# Patient Record
Sex: Female | Born: 1990 | Race: Black or African American | Hispanic: No | Marital: Single | Smoking: Current every day smoker
Health system: Southern US, Community
[De-identification: ages and names within clinical notes are randomized; demographics above are authoritative.]

## PROBLEM LIST (undated history)

## (undated) DIAGNOSIS — J45909 Unspecified asthma, uncomplicated: Secondary | ICD-10-CM

## (undated) DIAGNOSIS — A749 Chlamydial infection, unspecified: Secondary | ICD-10-CM

## (undated) DIAGNOSIS — I10 Essential (primary) hypertension: Secondary | ICD-10-CM

## (undated) HISTORY — PX: HERNIA REPAIR: SHX51

---
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
## 2009-07-31 ENCOUNTER — Telehealth (INDEPENDENT_AMBULATORY_CARE_PROVIDER_SITE_OTHER): Payer: Self-pay | Admitting: Internal Medicine

## 2010-07-28 NOTE — L&D Delivery Note (Signed)
Delivery Note At  a viable female infant was delivered via  (Presentation: LOA;  ).  APGAR: , ; weight .   Placenta status:spont , .  Cord:3vc  with the following complications:none .    Anesthesia:  none Episiotomy: none Lacerations: none Suture Repair: none Est. Blood Loss300 (mL):   Mom to postpartum.  Baby to nursery-stable.  Zerita Boers 04/27/2011, 12:59 PM

## 2010-08-01 ENCOUNTER — Emergency Department (HOSPITAL_COMMUNITY)
Admission: EM | Admit: 2010-08-01 | Discharge: 2010-08-01 | Payer: Self-pay | Source: Home / Self Care | Admitting: Emergency Medicine

## 2010-08-01 LAB — POCT PREGNANCY, URINE: Preg Test, Ur: NEGATIVE

## 2010-08-27 NOTE — Progress Notes (Signed)
  Phone Note Other Incoming   Summary of Call: Request from EMR to remove diagnosis--ICD 9 code apparently for HIV and problem list documents as HIV negative test from preload. Initial call taken by: Julieanne Manson MD,  July 31, 2009 4:46 PM

## 2010-10-21 ENCOUNTER — Inpatient Hospital Stay (INDEPENDENT_AMBULATORY_CARE_PROVIDER_SITE_OTHER)
Admission: RE | Admit: 2010-10-21 | Discharge: 2010-10-21 | Disposition: A | Discharge status: Home / Self Care | Payer: Medicaid Other | Source: Ambulatory Visit | Attending: Emergency Medicine | Admitting: Emergency Medicine

## 2010-10-21 DIAGNOSIS — Z331 Pregnant state, incidental: Secondary | ICD-10-CM

## 2010-10-21 DIAGNOSIS — R198 Other specified symptoms and signs involving the digestive system and abdomen: Secondary | ICD-10-CM

## 2010-10-21 LAB — POCT URINALYSIS DIP (DEVICE)
Bilirubin Urine: NEGATIVE
Glucose, UA: NEGATIVE mg/dL
Hgb urine dipstick: NEGATIVE
Ketones, ur: 40 mg/dL — AB
Nitrite: NEGATIVE
Protein, ur: 100 mg/dL — AB
Specific Gravity, Urine: >=1.03 (ref 1.005–1.030)
Urobilinogen, UA: 1 mg/dL (ref 0.0–1.0)
pH: 5.5 (ref 5.0–8.0)

## 2010-10-21 LAB — POCT PREGNANCY, URINE: Preg Test, Ur: POSITIVE

## 2010-11-29 ENCOUNTER — Inpatient Hospital Stay (HOSPITAL_COMMUNITY)
Admission: AD | Admit: 2010-11-29 | Discharge: 2010-11-29 | Disposition: A | Discharge status: Home / Self Care | Payer: BC Managed Care – PPO | Source: Ambulatory Visit | Attending: Obstetrics and Gynecology | Admitting: Obstetrics and Gynecology

## 2010-11-29 DIAGNOSIS — N760 Acute vaginitis: Secondary | ICD-10-CM

## 2010-11-29 DIAGNOSIS — O2390 Unspecified genitourinary tract infection in pregnancy, unspecified trimester: Secondary | ICD-10-CM

## 2010-11-29 DIAGNOSIS — R109 Unspecified abdominal pain: Secondary | ICD-10-CM

## 2010-11-29 DIAGNOSIS — A499 Bacterial infection, unspecified: Secondary | ICD-10-CM

## 2010-11-29 DIAGNOSIS — B9689 Other specified bacterial agents as the cause of diseases classified elsewhere: Secondary | ICD-10-CM | POA: Insufficient documentation

## 2010-11-29 LAB — URINALYSIS, ROUTINE W REFLEX MICROSCOPIC
Bilirubin Urine: NEGATIVE
Glucose, UA: NEGATIVE mg/dL
Hgb urine dipstick: NEGATIVE
Ketones, ur: NEGATIVE mg/dL
Nitrite: NEGATIVE
Protein, ur: NEGATIVE mg/dL
Specific Gravity, Urine: 1.025 (ref 1.005–1.030)
Urobilinogen, UA: 1 mg/dL (ref 0.0–1.0)
pH: 6 (ref 5.0–8.0)

## 2010-11-29 LAB — WET PREP, GENITAL
Trich, Wet Prep: NONE SEEN
Yeast Wet Prep HPF POC: NONE SEEN

## 2010-11-30 LAB — GC/CHLAMYDIA PROBE AMP, GENITAL
Chlamydia, DNA Probe: NEGATIVE
GC Probe Amp, Genital: NEGATIVE

## 2011-01-11 ENCOUNTER — Inpatient Hospital Stay (HOSPITAL_COMMUNITY)
Admission: AD | Admit: 2011-01-11 | Discharge: 2011-01-11 | Disposition: A | Discharge status: Home / Self Care | Payer: BC Managed Care – PPO | Source: Ambulatory Visit | Attending: Obstetrics & Gynecology | Admitting: Obstetrics & Gynecology

## 2011-01-11 DIAGNOSIS — N760 Acute vaginitis: Secondary | ICD-10-CM

## 2011-01-11 DIAGNOSIS — R109 Unspecified abdominal pain: Secondary | ICD-10-CM

## 2011-01-11 DIAGNOSIS — A499 Bacterial infection, unspecified: Secondary | ICD-10-CM | POA: Insufficient documentation

## 2011-01-11 DIAGNOSIS — O2390 Unspecified genitourinary tract infection in pregnancy, unspecified trimester: Secondary | ICD-10-CM | POA: Insufficient documentation

## 2011-01-11 DIAGNOSIS — B9689 Other specified bacterial agents as the cause of diseases classified elsewhere: Secondary | ICD-10-CM | POA: Insufficient documentation

## 2011-01-11 LAB — URINALYSIS, ROUTINE W REFLEX MICROSCOPIC
Bilirubin Urine: NEGATIVE
Glucose, UA: NEGATIVE mg/dL
Hgb urine dipstick: NEGATIVE
Ketones, ur: NEGATIVE mg/dL
Nitrite: NEGATIVE
Protein, ur: NEGATIVE mg/dL
Specific Gravity, Urine: 1.01 (ref 1.005–1.030)
Urobilinogen, UA: 0.2 mg/dL (ref 0.0–1.0)
pH: 7 (ref 5.0–8.0)

## 2011-01-11 LAB — CBC
HCT: 31 % — ABNORMAL LOW (ref 36.0–46.0)
Hemoglobin: 10.2 g/dL — ABNORMAL LOW (ref 12.0–15.0)
MCH: 28.4 pg (ref 26.0–34.0)
MCHC: 32.9 g/dL (ref 30.0–36.0)
MCV: 86.4 fL (ref 78.0–100.0)
Platelets: 238 10*3/uL (ref 150–400)
RBC: 3.59 MIL/uL — ABNORMAL LOW (ref 3.87–5.11)
RDW: 13.8 % (ref 11.5–15.5)
WBC: 11.2 10*3/uL — ABNORMAL HIGH (ref 4.0–10.5)

## 2011-01-11 LAB — URINE MICROSCOPIC-ADD ON

## 2011-02-12 ENCOUNTER — Other Ambulatory Visit: Payer: Self-pay | Admitting: Family Medicine

## 2011-02-12 DIAGNOSIS — Z3689 Encounter for other specified antenatal screening: Secondary | ICD-10-CM

## 2011-02-12 LAB — ANTIBODY SCREEN: Antibody Screen: NEGATIVE

## 2011-02-12 LAB — RPR
RPR: NONREACTIVE
RPR: NONREACTIVE
RPR: NONREACTIVE

## 2011-02-12 LAB — HIV ANTIBODY (ROUTINE TESTING W REFLEX)
HIV: NONREACTIVE
HIV: NONREACTIVE

## 2011-02-12 LAB — ABO/RH: RH Type: POSITIVE

## 2011-02-14 ENCOUNTER — Ambulatory Visit (HOSPITAL_COMMUNITY)
Admission: RE | Admit: 2011-02-14 | Discharge: 2011-02-14 | Disposition: A | Discharge status: Home / Self Care | Payer: BC Managed Care – PPO | Source: Ambulatory Visit | Attending: Family Medicine | Admitting: Family Medicine

## 2011-02-14 DIAGNOSIS — Z363 Encounter for antenatal screening for malformations: Secondary | ICD-10-CM | POA: Insufficient documentation

## 2011-02-14 DIAGNOSIS — O3660X0 Maternal care for excessive fetal growth, unspecified trimester, not applicable or unspecified: Secondary | ICD-10-CM | POA: Insufficient documentation

## 2011-02-14 DIAGNOSIS — Z3689 Encounter for other specified antenatal screening: Secondary | ICD-10-CM

## 2011-02-14 DIAGNOSIS — Z1389 Encounter for screening for other disorder: Secondary | ICD-10-CM | POA: Insufficient documentation

## 2011-02-14 DIAGNOSIS — O358XX0 Maternal care for other (suspected) fetal abnormality and damage, not applicable or unspecified: Secondary | ICD-10-CM | POA: Insufficient documentation

## 2011-04-27 ENCOUNTER — Inpatient Hospital Stay (HOSPITAL_COMMUNITY)
Admission: AD | Admit: 2011-04-27 | Discharge: 2011-04-29 | DRG: 373 | Disposition: A | Discharge status: Home / Self Care | Payer: BC Managed Care – PPO | Source: Ambulatory Visit | Attending: Obstetrics & Gynecology | Admitting: Obstetrics & Gynecology

## 2011-04-27 ENCOUNTER — Encounter (HOSPITAL_COMMUNITY): Payer: Self-pay | Admitting: Obstetrics and Gynecology

## 2011-04-27 HISTORY — DX: Chlamydial infection, unspecified: A74.9

## 2011-04-27 LAB — CBC
Hemoglobin: 11.7 g/dL — ABNORMAL LOW (ref 12.0–15.0)
MCH: 27.8 pg (ref 26.0–34.0)
RBC: 4.21 MIL/uL (ref 3.87–5.11)

## 2011-04-27 LAB — HIV ANTIBODY (ROUTINE TESTING W REFLEX): HIV: NONREACTIVE

## 2011-04-27 LAB — RPR: RPR Ser Ql: NONREACTIVE

## 2011-04-27 MED ORDER — LACTATED RINGERS IV SOLN
INTRAVENOUS | Status: DC
  Administered 2011-04-27: 125 mL/h via INTRAVENOUS

## 2011-04-27 MED ORDER — DIBUCAINE 1 % RE OINT: 1.0000 "application " | TOPICAL_OINTMENT | RECTAL | Status: DC | PRN

## 2011-04-27 MED ORDER — OXYTOCIN 20 UNITS IN LACTATED RINGERS INFUSION - SIMPLE
INTRAVENOUS | Status: AC
  Filled 2011-04-27: qty 1000

## 2011-04-27 MED ORDER — MEASLES, MUMPS & RUBELLA VAC ~~LOC~~ INJ
0.5000 mL | INJECTION | Freq: Once | SUBCUTANEOUS | Status: AC
  Administered 2011-04-29: 0.5 mL via SUBCUTANEOUS
  Filled 2011-04-27 (×2): qty 0.5

## 2011-04-27 MED ORDER — OXYTOCIN BOLUS FROM INFUSION
500.0000 mL | Freq: Once | INTRAVENOUS | Status: DC
  Administered 2011-04-27: 500 mL via INTRAVENOUS
  Filled 2011-04-27: qty 500

## 2011-04-27 MED ORDER — CITRIC ACID-SODIUM CITRATE 334-500 MG/5ML PO SOLN: 30.0000 mL | ORAL | Status: DC | PRN

## 2011-04-27 MED ORDER — SIMETHICONE 80 MG PO CHEW: 80.0000 mg | CHEWABLE_TABLET | ORAL | Status: DC | PRN

## 2011-04-27 MED ORDER — WITCH HAZEL-GLYCERIN EX PADS: 1.0000 "application " | MEDICATED_PAD | CUTANEOUS | Status: DC | PRN

## 2011-04-27 MED ORDER — IBUPROFEN 600 MG PO TABS: 600.0000 mg | ORAL_TABLET | Freq: Four times a day (QID) | ORAL | Status: DC | PRN

## 2011-04-27 MED ORDER — DIPHENHYDRAMINE HCL 25 MG PO CAPS: 25.0000 mg | ORAL_CAPSULE | Freq: Four times a day (QID) | ORAL | Status: DC | PRN

## 2011-04-27 MED ORDER — OXYCODONE-ACETAMINOPHEN 5-325 MG PO TABS: 2.0000 | ORAL_TABLET | ORAL | Status: DC | PRN

## 2011-04-27 MED ORDER — ZOLPIDEM TARTRATE 5 MG PO TABS: 5.0000 mg | ORAL_TABLET | Freq: Every evening | ORAL | Status: DC | PRN

## 2011-04-27 MED ORDER — ONDANSETRON HCL 4 MG PO TABS: 4.0000 mg | ORAL_TABLET | ORAL | Status: DC | PRN

## 2011-04-27 MED ORDER — FLEET ENEMA 7-19 GM/118ML RE ENEM: 1.0000 | ENEMA | RECTAL | Status: DC | PRN

## 2011-04-27 MED ORDER — PRENATAL PLUS 27-1 MG PO TABS
1.0000 | ORAL_TABLET | Freq: Every day | ORAL | Status: DC
  Administered 2011-04-28 – 2011-04-29 (×2): 1 via ORAL
  Filled 2011-04-27 (×2): qty 1

## 2011-04-27 MED ORDER — LIDOCAINE HCL (PF) 1 % IJ SOLN
INTRAMUSCULAR | Status: AC
  Filled 2011-04-27: qty 30

## 2011-04-27 MED ORDER — LACTATED RINGERS IV SOLN
500.0000 mL | INTRAVENOUS | Status: DC | PRN
Start: 2011-04-27 — End: 2011-04-27

## 2011-04-27 MED ORDER — ONDANSETRON HCL 4 MG/2ML IJ SOLN: 4.0000 mg | INTRAMUSCULAR | Status: DC | PRN

## 2011-04-27 MED ORDER — IBUPROFEN 600 MG PO TABS
600.0000 mg | ORAL_TABLET | Freq: Four times a day (QID) | ORAL | Status: DC
  Administered 2011-04-27 – 2011-04-29 (×7): 600 mg via ORAL
  Filled 2011-04-27 (×7): qty 1

## 2011-04-27 MED ORDER — TETANUS-DIPHTH-ACELL PERTUSSIS 5-2.5-18.5 LF-MCG/0.5 IM SUSP
0.5000 mL | Freq: Once | INTRAMUSCULAR | Status: AC
  Administered 2011-04-28: 0.5 mL via INTRAMUSCULAR
  Filled 2011-04-27: qty 0.5

## 2011-04-27 MED ORDER — LIDOCAINE HCL (PF) 1 % IJ SOLN: 30.0000 mL | INTRAMUSCULAR | Status: DC | PRN

## 2011-04-27 MED ORDER — ONDANSETRON HCL 4 MG/2ML IJ SOLN: 4.0000 mg | Freq: Four times a day (QID) | INTRAMUSCULAR | Status: DC | PRN

## 2011-04-27 MED ORDER — ACETAMINOPHEN 325 MG PO TABS: 650.0000 mg | ORAL_TABLET | ORAL | Status: DC | PRN

## 2011-04-27 MED ORDER — OXYTOCIN 20 UNITS IN LACTATED RINGERS INFUSION - SIMPLE: 125.0000 mL/h | Freq: Once | INTRAVENOUS | Status: DC

## 2011-04-27 MED ORDER — NALBUPHINE SYRINGE 5 MG/0.5 ML
10.0000 mg | INJECTION | INTRAMUSCULAR | Status: DC | PRN
  Administered 2011-04-27: 10 mg via INTRAVENOUS
  Filled 2011-04-27: qty 1

## 2011-04-27 MED ORDER — BENZOCAINE-MENTHOL 20-0.5 % EX AERO: 1.0000 "application " | INHALATION_SPRAY | CUTANEOUS | Status: DC | PRN

## 2011-04-27 MED ORDER — OXYCODONE-ACETAMINOPHEN 5-325 MG PO TABS: 1.0000 | ORAL_TABLET | ORAL | Status: DC | PRN

## 2011-04-27 MED ORDER — SENNOSIDES-DOCUSATE SODIUM 8.6-50 MG PO TABS
2.0000 | ORAL_TABLET | Freq: Every day | ORAL | Status: DC
  Administered 2011-04-27 – 2011-04-28 (×2): 2 via ORAL

## 2011-04-27 MED ORDER — LANOLIN HYDROUS EX OINT: TOPICAL_OINTMENT | CUTANEOUS | Status: DC | PRN

## 2011-04-27 NOTE — Progress Notes (Signed)
Spontaneous vaginal delivery of viable female.  See delivery record

## 2011-04-27 NOTE — H&P (Signed)
Agree with above note.  Natalie Skinner H. 04/27/2011 2:52 PM  

## 2011-04-27 NOTE — Progress Notes (Signed)
Pt is a G1 presents to [facility] via EMS with abdominal pain that comes and goes every 3 mins. PT is [redacted] weeks pregnant, receives care at the health dept.

## 2011-04-27 NOTE — H&P (Signed)
Natalie Skinner is a 20 y.o. female presenting for labor. Maternal Medical History:  Reason for admission: Reason for admission: contractions.  Contractions: Onset was 13-24 hours ago.   Frequency: regular.   Perceived severity is strong.    Fetal activity: Perceived fetal activity is normal.   Last perceived fetal movement was within the past hour.      OB History    Grav Para Term Preterm Abortions TAB SAB Ect Mult Living   1 0 0 0 0 0 0 0 0 0      Past Medical History  Diagnosis Date  . Chlamydia    Past Surgical History  Procedure Date  . Hernia repair    Family History: family history is not on file. Social History:  reports that she has never smoked. She does not have any smokeless tobacco history on file. She reports that she does not drink alcohol or use illicit drugs.  Review of Systems  Constitutional: Negative.   HENT: Negative.   Eyes: Negative.   Respiratory: Negative.   Cardiovascular: Negative.   Gastrointestinal: Negative.   Genitourinary: Negative.   Musculoskeletal: Negative.   Skin: Negative.   Neurological: Negative.   Endo/Heme/Allergies: Negative.   Psychiatric/Behavioral: Negative.     Dilation: 10 Effacement (%): 100 Station: -1 Blood pressure 126/76, pulse 59, temperature 97.9 F (36.6 C), temperature source Oral, resp. rate 18. Maternal Exam:  Uterine Assessment: Contraction strength is firm.  Contraction frequency is regular.   Abdomen: Patient reports no abdominal tenderness. Fetal presentation: vertex  Introitus: Normal vulva. Normal vagina.    Physical Exam  Constitutional: She is oriented to person, place, and time. She appears well-developed and well-nourished.  Cardiovascular: Normal rate, regular rhythm, normal heart sounds and intact distal pulses.   Respiratory: Effort normal and breath sounds normal.  GI: Soft. Bowel sounds are normal.  Genitourinary: Vagina normal and uterus normal.  Musculoskeletal: Normal range of  motion.  Neurological: She is alert and oriented to person, place, and time. She has normal reflexes.  Skin: Skin is warm and dry.  Psychiatric: She has a normal mood and affect. Her behavior is normal. Judgment and thought content normal.    Prenatal labs: ABO, Rh:   Antibody:   Rubella:   RPR:    HBsAg:    HIV:    GBS:     Assessment/Plan: Active labor, impending delivery   Zerita Boers 04/27/2011, 12:19 PM

## 2011-04-28 NOTE — Progress Notes (Signed)
Post Partum Day 1 Subjective: no complaints  Objective: Blood pressure 116/65, pulse 76, temperature 98.1 F (36.7 C), temperature source Oral, resp. rate 20, height 5\' 7"  (1.702 m), weight 242 lb (109.77 kg), SpO2 97.00%, unknown if currently breastfeeding.  Physical Exam:  General: alert, cooperative, appears stated age and no distress Lochia: appropriate Uterine Fundus: firm Incision: NA DVT Evaluation: No evidence of DVT seen on physical exam.   Basename 04/27/11 1253  HGB 11.7*  HCT 36.0    Assessment/Plan: Plan for discharge tomorrow, Breastfeeding, Circumcision prior to discharge and Contraception needs progesterone only pills - Micronor    LOS: 1 day   Tyrik Stetzer 04/28/2011, 7:30 AM

## 2011-04-29 MED ORDER — IBUPROFEN 600 MG PO TABS: 600.0000 mg | ORAL_TABLET | Freq: Four times a day (QID) | ORAL | Status: AC

## 2011-04-29 NOTE — Progress Notes (Signed)
PSYCHOSOCIAL ASSESSMENT ~ MATERNAL/CHILD Name:   Natalie Skinner, III                                                                                                       Age: 20  Referral Date:        10/02   /  12 Reason/Source: LPNC /CN  I. FAMILY/HOME ENVIRONMENT A. Child's Legal Guardian __X_Parent(s) ___Grandparent ___Foster parent ___DSS_________________ Name:    Sandrea Hughs                                                            DOB: //                     Age: 33   Address: [address]; [place], [place] [address]  Name:  Angela Cox                                                               DOB: //                     Age: 1  Address:  B. Other Household Members/Support Persons Name:                                         Relationship:                        DOB ___/___/___                   Name:                                         Relationship:                        DOB ___/___/___                   Name:                                         Relationship:                        DOB ___/___/___  Name:                                         Relationship:                        DOB ___/___/___  C. Other Support:   II. PSYCHOSOCIAL DATA A. Information Source                                                                                             _X_Patient Interview  __Family Interview           __Other___________  B. 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  ___School:                                                                         Grade:  __Other:   Salena Saner Cultural and Environment Information Cultural Issues Impacting  Care:  III. STRENGTHS _X__Supportive family/friends _X__Adequate Resources ___Compliance with medical plan _X__Home prepared for Child (including basic supplies) ___Understanding of illness      ___Other: RISK FACTORS AND CURRENT PROBLEMS         ____No Problems Noted             LPNC  IV. SOCIAL WORK ASSESSMENT  Pt didn't start [facility] prior to 28 weeks because she was unaware of pregnancy.  Once pregnancy was confirmed by staff at an Urgent Care, she started [facility] and kept appointments regularly.  She denies any illegal substance use and verbalized understanding of hospital drug testing policy.  UDS was missed.  Meconium results are pending.   FOB is at the bedside and supportive.  She has supplies for the infant.  Sw will follow up with drug screen results and make a referral if needed.    V. SOCIAL WORK PLAN  _X__No Further Intervention Required/No Barriers to Discharge   ___Psychosocial Support and Ongoing Assessment of Needs   ___Patient/Family Education:   ___Child Protective Services Report   County___________ Date___/____/____   ___Information/Referral to [employer]_________________________   ___Other:

## 2011-04-29 NOTE — Discharge Summary (Signed)
Obstetric Discharge Summary Reason for Admission: onset of labor Prenatal Procedures: ultrasound Intrapartum Procedures: spontaneous vaginal delivery Postpartum Procedures: none Complications-Operative and Postpartum: none Hemoglobin  Date Value Range Status  04/27/2011 11.7* 12.0-15.0 (g/dL) Final     HCT  Date Value Range Status  04/27/2011 36.0  36.0-46.0 (%) Final    Discharge Diagnoses: Term Pregnancy-delivered  Discharge Information: Date: 04/29/2011 Activity: pelvic rest Diet: routine Medications: PNV and Ibuprofen, Micronor for contraception - told to start 4 weeks after d/c  Condition: stable Instructions: refer to practice specific booklet Discharge to: home Follow-up Information    Follow up with [facility] [place]. Make an appointment in 6 weeks. (Postpartrum visit)    Contact information:   [address] [employer] [place] [address]          Newborn Data: Live born female  Birth Weight: 7 lb (3175 g) APGAR: 8, 9  Home with mother.  SMITH,JOSHUA 04/29/2011, 7:47 AM

## 2011-04-29 NOTE — Discharge Instructions (Signed)

## 2011-04-29 NOTE — Progress Notes (Signed)
Post Partum Day 2 Subjective: no complaints, voiding, tolerating PO, + flatus and No BM yet  Objective: Blood pressure 105/64, pulse 71, temperature 98.6 F (37 C), temperature source Oral, resp. rate 18, height 5\' 7"  (1.702 m), weight 109.77 kg (242 lb), SpO2 99.00%, unknown if currently breastfeeding.  Physical Exam:  General: alert, cooperative and no distress Lochia: appropriate Uterine Fundus: firm DVT Evaluation: No evidence of DVT seen on physical exam. Negative Homan's sign. No cords or calf tenderness. No significant calf/ankle edema.   Basename 04/27/11 1253  HGB 11.7*  HCT 36.0    Assessment/Plan: Discharge home and Circumcision prior to discharge   LOS: 2 days   Karandeep Resende 04/29/2011, 7:15 AM

## 2011-04-30 NOTE — Discharge Summary (Signed)
Agree with above note.  Natalie Skinner H. 04/30/2011 9:43 AM

## 2011-05-03 NOTE — Progress Notes (Signed)
I agree with the above. Cam Hai 8:33 PM 05/03/2011

## 2011-11-01 IMAGING — US US OB DETAIL+14 WK
1 series · 12 of 28 positions shown · non-contrast
Comparison: none

[Series 1: us ob detail +14 wk · 12 of 96 slices shown]
[im 4/96]
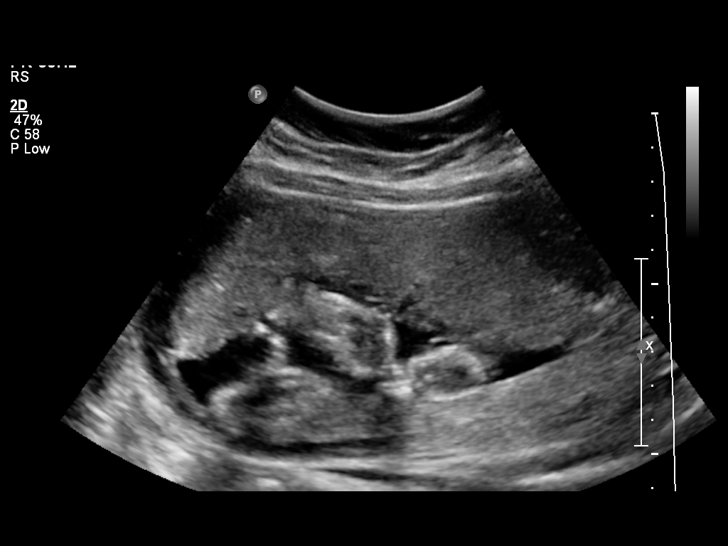
[im 11/96]
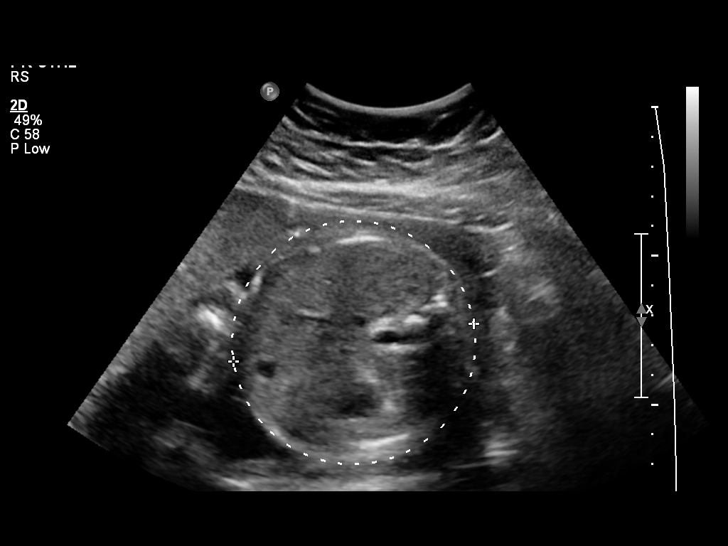
[im 18/96]
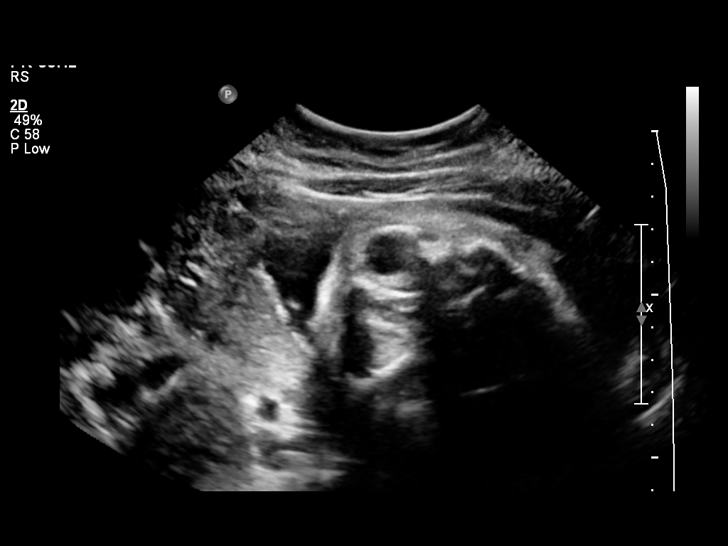
[im 29/96]
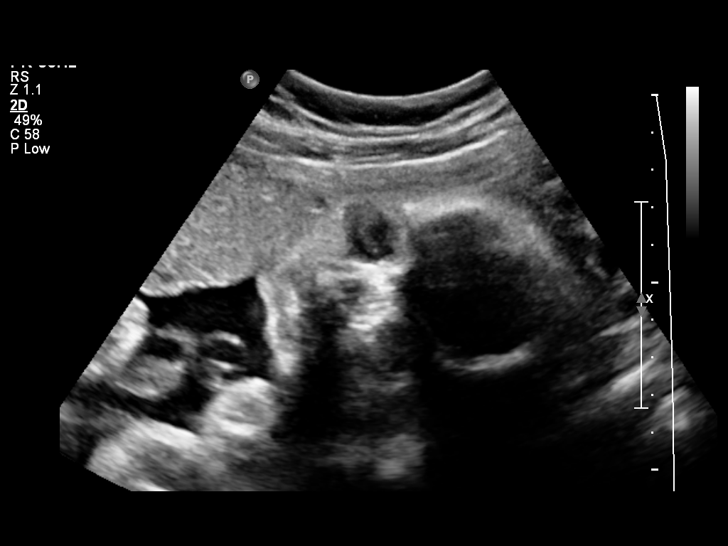
[im 36/96]
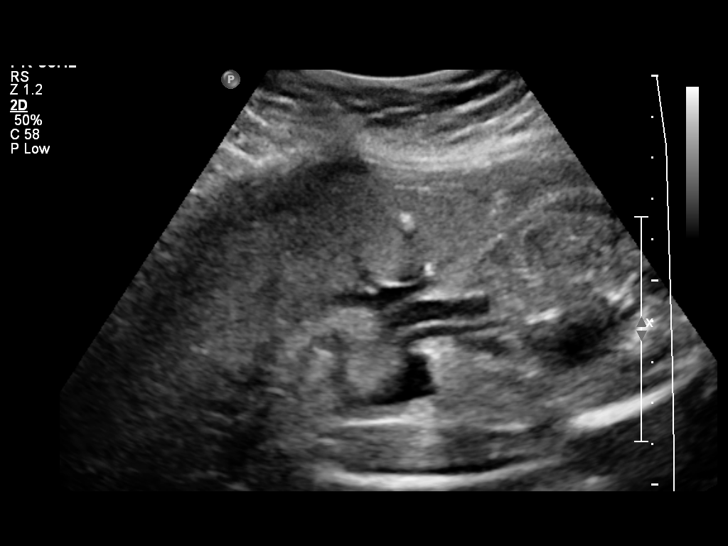
[im 43/96]
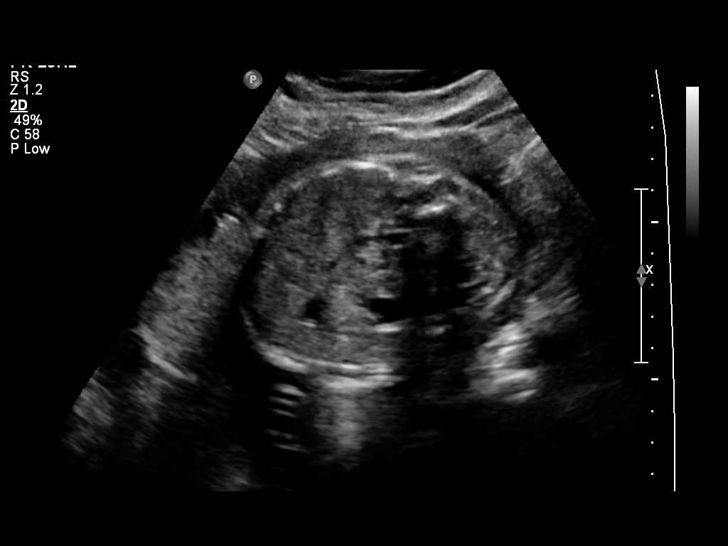
[im 53/96]
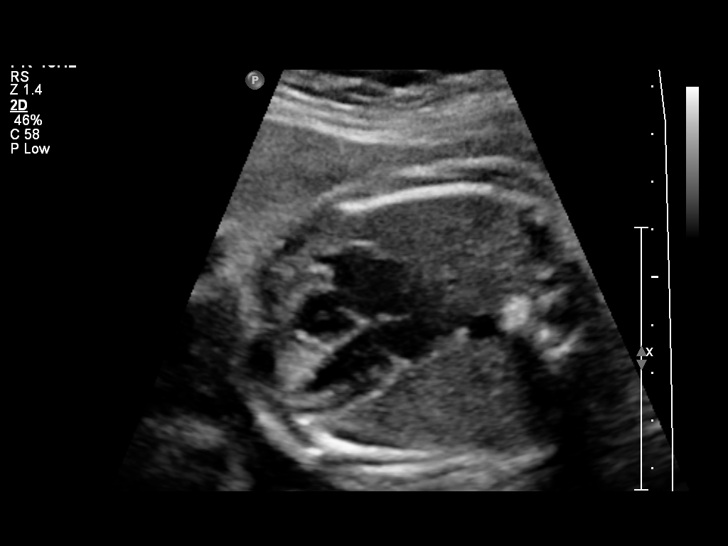
[im 60/96]
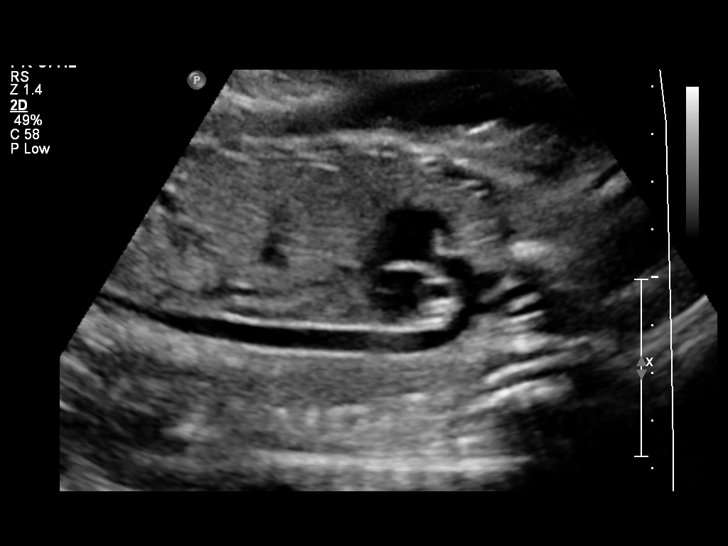
[im 67/96]
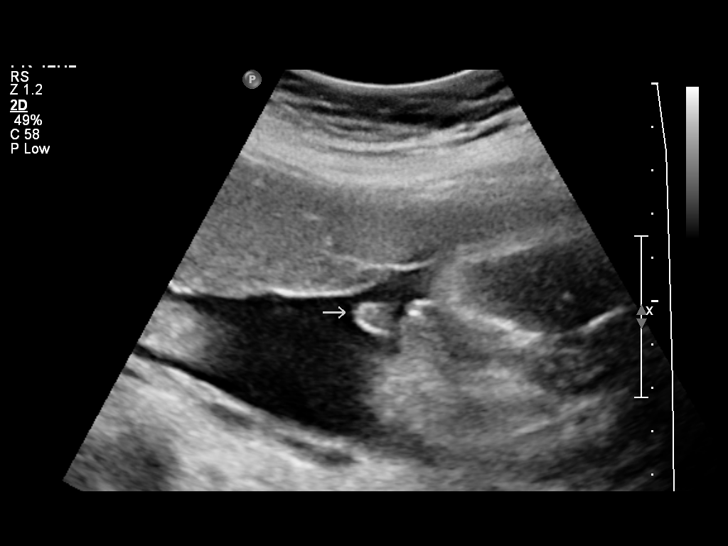
[im 78/96]
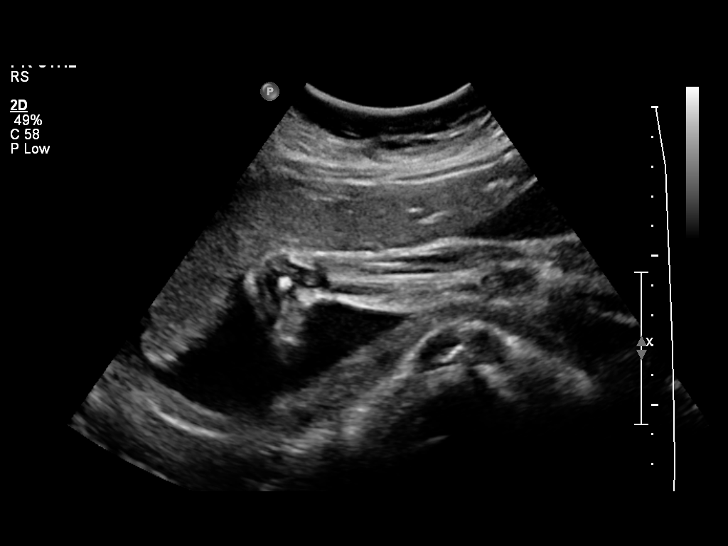
[im 85/96]
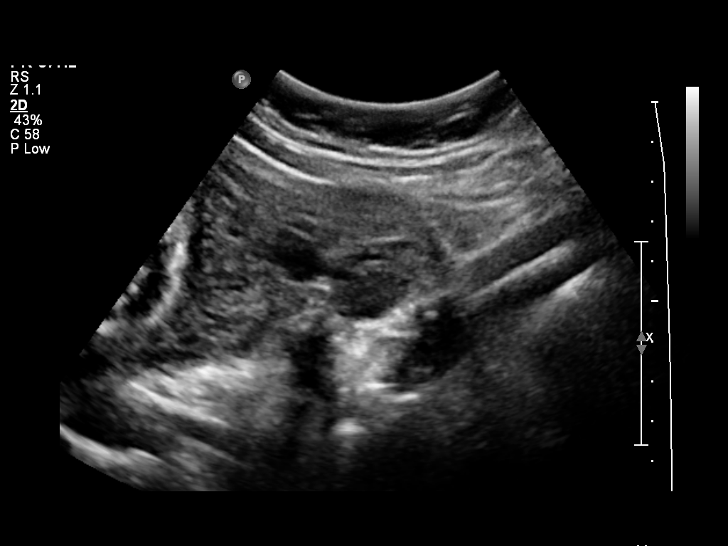
[im 92/96]
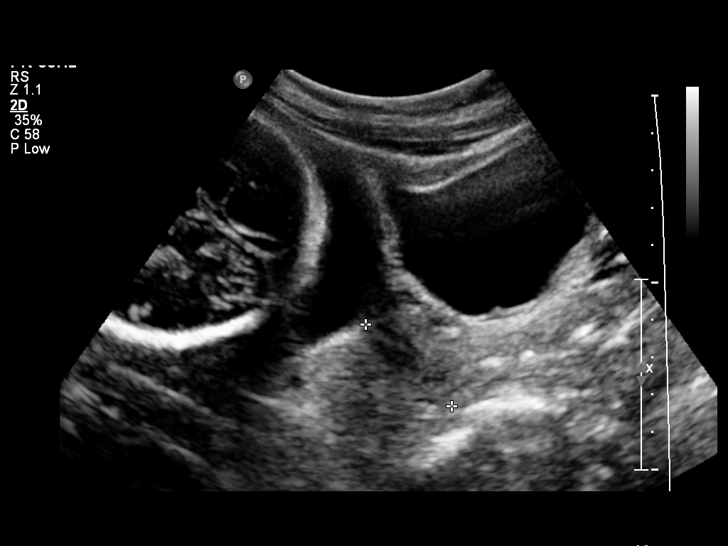

[12 of 28 positions shown; findings below may reference images not displayed]

OBSTETRICS REPORT
                      (Signed Final 02/14/2011 [DATE])

 Order#:         [id number]
Procedures

 US OB DETAIL + 14 WK                                  76811.0
Indications

 Detailed fetal anatomic survey
 Late prenatal care
 Size greater than dates (Large for gestational [AGE]
Fetal Evaluation

 Fetal Heart Rate:  142                          bpm
 Cardiac Activity:  Observed
 Presentation:      Cephalic
 Placenta:          Anterior, above cervical os
 P. Cord            Appears WNL
 Insertion:

 Amniotic Fluid
 AFI FV:      Subjectively within normal limits
 AFI Sum:     12.15   cm       31  %Tile     Larg Pckt:    4.61  cm
 RUQ:   2.23    cm   RLQ:    4.61   cm    LUQ:   2.38    cm   LLQ:    2.93   cm
Biometry

 BPD:     73.4  mm     G. Age:  29w 3d                CI:        71.06   70 - 86
                                                      FL/HC:      20.5   19.2 -

 HC:     277.4  mm     G. Age:  30w 2d       29  %    HC/AC:      1.10   0.99 -

 AC:     253.3  mm     G. Age:  29w 4d       35  %    FL/BPD:     77.7   71 - 87
 FL:        57  mm     G. Age:  29w 6d       37  %    FL/AC:      22.5   20 - 24
 HUM:     50.3  mm     G. Age:  29w 3d       44  %

 Est. FW:    6886  gm      3 lb 3 oz     50  %
Gestational Age

 LMP:           24w 5d        Date:  08/25/10                 EDD:   06/01/11
 U/S Today:     29w 6d                                        EDD:   04/26/11
 Best:          29w 6d     Det. By:  U/S (02/14/11)           EDD:   04/26/11
Genetic Sonogram - Trisomy 21 Screening
 Age:                                             19          Risk=1:   885
 Echogenic bowel:                                 No          LR :
 Choroid plexus cysts:                            No
 Structural anomalies (inc. cardiac):             No          LR :
 Short femur:                                     No          LR :
 Short humerus:                                   No          LR :
 2-vessel umbilical cord:                         No
 Pyelectasis:                                     No          LR :
 Echogenic cardiac foci:                          No          LR :

 8 Of 8 Criteria Were Visualized and 0 Abnormal(s) Were Seen.
 Ultrasound Modified Risk for Fetal Down Syndrome = [DATE]
Anatomy

 Cranium:           Appears normal      Aortic Arch:       Appears normal
 Fetal Cavum:       Appears normal      Ductal Arch:       Appears normal
 Ventricles:        Appears normal      Diaphragm:         Appears normal
 Choroid Plexus:    Appears normal      Stomach:           Appears
                                                           normal, left
                                                           sided
 Cerebellum:        Appears normal      Abdomen:           Appears normal
 Posterior Fossa:   Appears normal      Abdominal Wall:    Appears nml
                                                           (cord insert,
                                                           abd wall)
 Nuchal Fold:       Not applicable      Cord Vessels:      Appears normal
                    (>20 wks GA)                           (3 vessel cord)
 Face:              Appears normal      Kidneys:           Appear normal
                    (lips/profile/orbit
                    s)
 Heart:             Appears normal      Bladder:           Appears normal
                    (4 chamber &
                    axis)
 RVOT:              Appears normal      Spine:             Appears normal
 LVOT:              Appears normal      Limbs:             Four extremities
                                                           seen

 Other:     Male gender. Nasal bone visualized. Heels visualized.
            Technically difficult due to advanced GA and fetal
            position.
Cervix Uterus Adnexa

 Cervical Length:    3.3      cm

 Cervix:       Normal appearance by transabdominal scan.

 Left Ovary:    Within normal limits.
 Right Ovary:   Within normal limits.
 Adnexa:     No abnormality visualized.
Impression

 Single live IUP in cephalic presentation.  Measuring 01w8d,
 suggesting inaccuracy of the LMP.
 No anatomic abnormality seen with a good quality survey
 possible.

 questions or concerns.

## 2012-01-14 ENCOUNTER — Encounter (HOSPITAL_COMMUNITY): Payer: Self-pay | Admitting: Emergency Medicine

## 2012-01-14 ENCOUNTER — Emergency Department (HOSPITAL_COMMUNITY)
Admission: EM | Admit: 2012-01-14 | Discharge: 2012-01-14 | Disposition: A | Discharge status: Home / Self Care | Payer: BC Managed Care – PPO | Attending: Emergency Medicine | Admitting: Emergency Medicine

## 2012-01-14 DIAGNOSIS — Y92410 Unspecified street and highway as the place of occurrence of the external cause: Secondary | ICD-10-CM | POA: Insufficient documentation

## 2012-01-14 DIAGNOSIS — N390 Urinary tract infection, site not specified: Secondary | ICD-10-CM

## 2012-01-14 DIAGNOSIS — I10 Essential (primary) hypertension: Secondary | ICD-10-CM | POA: Insufficient documentation

## 2012-01-14 DIAGNOSIS — J45909 Unspecified asthma, uncomplicated: Secondary | ICD-10-CM | POA: Insufficient documentation

## 2012-01-14 DIAGNOSIS — M25569 Pain in unspecified knee: Secondary | ICD-10-CM | POA: Insufficient documentation

## 2012-01-14 DIAGNOSIS — F17200 Nicotine dependence, unspecified, uncomplicated: Secondary | ICD-10-CM | POA: Insufficient documentation

## 2012-01-14 DIAGNOSIS — R109 Unspecified abdominal pain: Secondary | ICD-10-CM | POA: Insufficient documentation

## 2012-01-14 HISTORY — DX: Unspecified asthma, uncomplicated: J45.909

## 2012-01-14 HISTORY — DX: Essential (primary) hypertension: I10

## 2012-01-14 LAB — URINE MICROSCOPIC-ADD ON

## 2012-01-14 LAB — URINALYSIS, ROUTINE W REFLEX MICROSCOPIC
Glucose, UA: NEGATIVE mg/dL
Hgb urine dipstick: NEGATIVE
Ketones, ur: NEGATIVE mg/dL
Protein, ur: 100 mg/dL — AB
pH: 6 (ref 5.0–8.0)

## 2012-01-14 LAB — POCT PREGNANCY, URINE: Preg Test, Ur: NEGATIVE

## 2012-01-14 MED ORDER — IBUPROFEN 800 MG PO TABS: 800.0000 mg | ORAL_TABLET | Freq: Three times a day (TID) | ORAL | Status: AC

## 2012-01-14 MED ORDER — DIAZEPAM 5 MG PO TABS: 5.0000 mg | ORAL_TABLET | Freq: Every day | ORAL | Status: AC

## 2012-01-14 MED ORDER — NITROFURANTOIN MONOHYD MACRO 100 MG PO CAPS: 100.0000 mg | ORAL_CAPSULE | Freq: Two times a day (BID) | ORAL | Status: AC
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
## 2012-07-28 NOTE — L&D Delivery Note (Signed)
Delivery Note At 1:35 AM a viable female was delivered via  (Presentation: ROA).  APGAR: 8, 9 ; weight pending .   Placenta status: spontaneous, intact.  Cord: 3VC,  with the following complications: none.    Anesthesia:  Fentanyl   Episiotomy: none Lacerations: none Est. Blood Loss (mL):  Mom to postpartum.  Baby to Couplet care / Skin to Skin.  STINSON, JACOB JEHIEL 07/25/2013, 1:50 AM

## 2013-03-17 ENCOUNTER — Other Ambulatory Visit (HOSPITAL_COMMUNITY): Payer: Self-pay | Admitting: Nurse Practitioner

## 2013-03-17 DIAGNOSIS — Z0489 Encounter for examination and observation for other specified reasons: Secondary | ICD-10-CM

## 2013-03-17 LAB — OB RESULTS CONSOLE HEPATITIS B SURFACE ANTIGEN: Hepatitis B Surface Ag: NEGATIVE

## 2013-03-17 LAB — OB RESULTS CONSOLE ANTIBODY SCREEN: Antibody Screen: NEGATIVE

## 2013-03-17 LAB — OB RESULTS CONSOLE ABO/RH: RH Type: POSITIVE

## 2013-03-17 LAB — OB RESULTS CONSOLE RPR: RPR: NONREACTIVE

## 2013-04-07 ENCOUNTER — Ambulatory Visit (HOSPITAL_COMMUNITY): Payer: BC Managed Care – PPO

## 2013-04-08 ENCOUNTER — Ambulatory Visit (HOSPITAL_COMMUNITY)
Admission: RE | Admit: 2013-04-08 | Discharge: 2013-04-08 | Disposition: A | Discharge status: Home / Self Care | Payer: BC Managed Care – PPO | Source: Ambulatory Visit | Attending: Nurse Practitioner | Admitting: Nurse Practitioner

## 2013-04-08 ENCOUNTER — Other Ambulatory Visit (HOSPITAL_COMMUNITY): Payer: Self-pay | Admitting: Nurse Practitioner

## 2013-04-08 DIAGNOSIS — O10019 Pre-existing essential hypertension complicating pregnancy, unspecified trimester: Secondary | ICD-10-CM | POA: Insufficient documentation

## 2013-04-08 DIAGNOSIS — Z0489 Encounter for examination and observation for other specified reasons: Secondary | ICD-10-CM

## 2013-04-08 DIAGNOSIS — Z3689 Encounter for other specified antenatal screening: Secondary | ICD-10-CM | POA: Insufficient documentation

## 2013-04-12 ENCOUNTER — Other Ambulatory Visit (HOSPITAL_COMMUNITY): Payer: Self-pay | Admitting: Nurse Practitioner

## 2013-04-12 DIAGNOSIS — Z0489 Encounter for examination and observation for other specified reasons: Secondary | ICD-10-CM

## 2013-04-18 ENCOUNTER — Ambulatory Visit (HOSPITAL_COMMUNITY): Payer: BC Managed Care – PPO

## 2013-04-22 ENCOUNTER — Ambulatory Visit (HOSPITAL_COMMUNITY)
Admission: RE | Admit: 2013-04-22 | Discharge: 2013-04-22 | Disposition: A | Discharge status: Home / Self Care | Payer: BC Managed Care – PPO | Source: Ambulatory Visit | Attending: Nurse Practitioner | Admitting: Nurse Practitioner

## 2013-04-22 ENCOUNTER — Encounter (HOSPITAL_COMMUNITY): Payer: Self-pay

## 2013-04-22 DIAGNOSIS — Z3689 Encounter for other specified antenatal screening: Secondary | ICD-10-CM | POA: Insufficient documentation

## 2013-04-22 DIAGNOSIS — Z0489 Encounter for examination and observation for other specified reasons: Secondary | ICD-10-CM

## 2013-04-22 DIAGNOSIS — O10019 Pre-existing essential hypertension complicating pregnancy, unspecified trimester: Secondary | ICD-10-CM | POA: Insufficient documentation

## 2013-04-22 DIAGNOSIS — O0930 Supervision of pregnancy with insufficient antenatal care, unspecified trimester: Secondary | ICD-10-CM | POA: Insufficient documentation

## 2013-06-28 LAB — OB RESULTS CONSOLE GC/CHLAMYDIA: Chlamydia: NEGATIVE

## 2013-06-30 LAB — OB RESULTS CONSOLE GBS: GBS: NEGATIVE

## 2013-07-24 ENCOUNTER — Encounter (HOSPITAL_COMMUNITY): Payer: Self-pay | Admitting: General Practice

## 2013-07-24 ENCOUNTER — Inpatient Hospital Stay (HOSPITAL_COMMUNITY)
Admission: AD | Admit: 2013-07-24 | Discharge: 2013-07-27 | DRG: 775 | Disposition: A | Discharge status: Home / Self Care | Payer: BC Managed Care – PPO | Source: Ambulatory Visit | Attending: Obstetrics and Gynecology | Admitting: Obstetrics and Gynecology

## 2013-07-24 DIAGNOSIS — O99334 Smoking (tobacco) complicating childbirth: Secondary | ICD-10-CM | POA: Diagnosis present

## 2013-07-24 DIAGNOSIS — O4290 Premature rupture of membranes, unspecified as to length of time between rupture and onset of labor, unspecified weeks of gestation: Secondary | ICD-10-CM | POA: Diagnosis present

## 2013-07-24 LAB — TYPE AND SCREEN
ABO/RH(D): A POS
Antibody Screen: NEGATIVE

## 2013-07-24 LAB — RPR: RPR Ser Ql: NONREACTIVE

## 2013-07-24 LAB — CBC
HCT: 32 % — ABNORMAL LOW (ref 36.0–46.0)
Hemoglobin: 10.5 g/dL — ABNORMAL LOW (ref 12.0–15.0)
MCH: 27.2 pg (ref 26.0–34.0)
MCHC: 32.8 g/dL (ref 30.0–36.0)
RBC: 3.86 MIL/uL — ABNORMAL LOW (ref 3.87–5.11)

## 2013-07-24 LAB — AMNISURE RUPTURE OF MEMBRANE (ROM) NOT AT ARMC: Amnisure ROM: POSITIVE

## 2013-07-24 MED ORDER — FENTANYL CITRATE 0.05 MG/ML IJ SOLN
100.0000 ug | INTRAMUSCULAR | Status: DC | PRN
  Administered 2013-07-24 (×2): 100 ug via INTRAVENOUS
  Filled 2013-07-24 (×3): qty 2

## 2013-07-24 MED ORDER — CITRIC ACID-SODIUM CITRATE 334-500 MG/5ML PO SOLN: 30.0000 mL | ORAL | Status: DC | PRN

## 2013-07-24 MED ORDER — OXYTOCIN 40 UNITS IN LACTATED RINGERS INFUSION - SIMPLE MED: 62.5000 mL/h | INTRAVENOUS | Status: DC

## 2013-07-24 MED ORDER — ACETAMINOPHEN 325 MG PO TABS: 650.0000 mg | ORAL_TABLET | ORAL | Status: DC | PRN

## 2013-07-24 MED ORDER — FLEET ENEMA 7-19 GM/118ML RE ENEM: 1.0000 | ENEMA | RECTAL | Status: DC | PRN

## 2013-07-24 MED ORDER — OXYTOCIN BOLUS FROM INFUSION
500.0000 mL | INTRAVENOUS | Status: DC
  Administered 2013-07-25: 500 mL via INTRAVENOUS

## 2013-07-24 MED ORDER — IBUPROFEN 600 MG PO TABS: 600.0000 mg | ORAL_TABLET | Freq: Four times a day (QID) | ORAL | Status: DC | PRN

## 2013-07-24 MED ORDER — LACTATED RINGERS IV SOLN
INTRAVENOUS | Status: DC
  Administered 2013-07-24 (×2): via INTRAVENOUS

## 2013-07-24 MED ORDER — LACTATED RINGERS IV SOLN
500.0000 mL | INTRAVENOUS | Status: DC | PRN
  Administered 2013-07-25: 300 mL via INTRAVENOUS

## 2013-07-24 MED ORDER — ONDANSETRON HCL 4 MG/2ML IJ SOLN
4.0000 mg | Freq: Four times a day (QID) | INTRAMUSCULAR | Status: DC | PRN
  Administered 2013-07-24: 4 mg via INTRAVENOUS
  Filled 2013-07-24: qty 2

## 2013-07-24 MED ORDER — OXYTOCIN 40 UNITS IN LACTATED RINGERS INFUSION - SIMPLE MED
1.0000 m[IU]/min | INTRAVENOUS | Status: DC
  Administered 2013-07-24: 2 m[IU]/min via INTRAVENOUS
  Filled 2013-07-24: qty 1000

## 2013-07-24 MED ORDER — TERBUTALINE SULFATE 1 MG/ML IJ SOLN: 0.2500 mg | Freq: Once | INTRAMUSCULAR | Status: AC | PRN

## 2013-07-24 MED ORDER — OXYCODONE-ACETAMINOPHEN 5-325 MG PO TABS: 1.0000 | ORAL_TABLET | ORAL | Status: DC | PRN

## 2013-07-24 MED ORDER — LIDOCAINE HCL (PF) 1 % IJ SOLN
30.0000 mL | INTRAMUSCULAR | Status: DC | PRN
  Filled 2013-07-24: qty 30

## 2013-07-24 NOTE — MAU Note (Signed)
Pt in to be evaluated for rupture of membranes

## 2013-07-24 NOTE — H&P (Signed)
JAZAE GANDOLFI is a 22 y.o. female presenting for PROM. Maternal Medical History:  Reason for admission: Rupture of membranes.  Nausea.  Fetal activity: Perceived fetal activity is normal.   Last perceived fetal movement was within the past hour.    Prenatal complications: no prenatal complications Prenatal Complications - Diabetes: none.    OB History   Grav Para Term Preterm Abortions TAB SAB Ect Mult Living   2 1 1  0 0 0 0 0 0 1     Past Medical History  Diagnosis Date  . Chlamydia   . Asthma   . Hypertension   . Normal labor 07/24/2013   Past Surgical History  Procedure Laterality Date  . Hernia repair     Family History: family history is not on file. Social History:  reports that she has been smoking.  She has never used smokeless tobacco. She reports that she does not drink alcohol or use illicit drugs.   Prenatal Transfer Tool  Maternal Diabetes: No Genetic Screening: Declined Maternal Ultrasounds/Referrals: Normal Fetal Ultrasounds or other Referrals:  None Maternal Substance Abuse:  No Significant Maternal Medications:  None Significant Maternal Lab Results:  None Other Comments:  None  Review of Systems  Constitutional: Negative for fever and chills.  Respiratory: Negative for cough, sputum production and shortness of breath.   Cardiovascular: Negative for chest pain.  Gastrointestinal: Negative for heartburn, nausea, vomiting, abdominal pain, diarrhea and constipation.  Neurological: Negative for dizziness.    Dilation: 2.5 Effacement (%): 50 Station: -3 Exam by:: Candelaria Celeste, MD Blood pressure 126/73, pulse 93, temperature 98.2 F (36.8 C), temperature source Oral, resp. rate 18, height 5\' 7"  (1.702 m), weight 129.865 kg (286 lb 4.8 oz), last menstrual period 11/23/2012. Maternal Exam:  Abdomen: Fundal height is term.   Estimated fetal weight is 7.5#.   Fetal presentation: vertex  Introitus: Normal vulva. Normal vagina.  Ferning test:  positive.      Fetal Exam Fetal Monitor Review: Mode: hand-held doppler probe.   Baseline rate: 140s.  Variability: moderate (6-25 bpm).   Pattern: accelerations present and no decelerations.    Fetal State Assessment: Category I - tracings are normal.     Physical Exam  Constitutional: She is oriented to person, place, and time. She appears well-developed and well-nourished.  Cardiovascular: Normal rate and regular rhythm.   Respiratory: Effort normal.  GI: Soft. She exhibits no distension and no mass. There is no tenderness. There is no rebound and no guarding.  Musculoskeletal: Normal range of motion. She exhibits no edema.  Neurological: She is alert and oriented to person, place, and time.  Skin: Skin is warm and dry.  Psychiatric: She has a normal mood and affect. Her behavior is normal. Judgment and thought content normal.    Prenatal labs: ABO, Rh:   A+ Antibody:  neg Rubella:  immune RPR:   NR HBsAg:   Neg HIV:   Neg GBS:   Neg  Assessment/Plan: 1.  G2P1001 at 40.3 weeks 2.  PROM 3.  GBS neg  Will admit to L&D and start pitocin.  Teaching service for peds, anticipates breast feeding, OCPs for birth control.  Anticipate vaginal delivery.   Eular Panek JEHIEL 07/24/2013, 4:26 PM

## 2013-07-25 ENCOUNTER — Encounter (HOSPITAL_COMMUNITY): Payer: Self-pay | Admitting: Family Medicine

## 2013-07-25 DIAGNOSIS — O99334 Smoking (tobacco) complicating childbirth: Secondary | ICD-10-CM

## 2013-07-25 DIAGNOSIS — O4290 Premature rupture of membranes, unspecified as to length of time between rupture and onset of labor, unspecified weeks of gestation: Secondary | ICD-10-CM

## 2013-07-25 LAB — CBC
HCT: 31.4 % — ABNORMAL LOW (ref 36.0–46.0)
MCH: 27.1 pg (ref 26.0–34.0)
MCHC: 32.8 g/dL (ref 30.0–36.0)
MCV: 82.6 fL (ref 78.0–100.0)
RBC: 3.8 MIL/uL — ABNORMAL LOW (ref 3.87–5.11)
RDW: 14.5 % (ref 11.5–15.5)
WBC: 18.7 10*3/uL — ABNORMAL HIGH (ref 4.0–10.5)

## 2013-07-25 MED ORDER — SENNOSIDES-DOCUSATE SODIUM 8.6-50 MG PO TABS
2.0000 | ORAL_TABLET | ORAL | Status: DC
  Administered 2013-07-25 – 2013-07-27 (×2): 2 via ORAL
  Filled 2013-07-25 (×2): qty 2

## 2013-07-25 MED ORDER — IBUPROFEN 600 MG PO TABS
600.0000 mg | ORAL_TABLET | Freq: Four times a day (QID) | ORAL | Status: DC
  Administered 2013-07-25 – 2013-07-27 (×9): 600 mg via ORAL
  Filled 2013-07-25 (×10): qty 1

## 2013-07-25 MED ORDER — BENZOCAINE-MENTHOL 20-0.5 % EX AERO
1.0000 "application " | INHALATION_SPRAY | CUTANEOUS | Status: DC | PRN
  Administered 2013-07-25: 1 via TOPICAL
  Filled 2013-07-25 (×3): qty 56

## 2013-07-25 MED ORDER — PNEUMOCOCCAL VAC POLYVALENT 25 MCG/0.5ML IJ INJ
0.5000 mL | INJECTION | INTRAMUSCULAR | Status: AC
  Administered 2013-07-26: 0.5 mL via INTRAMUSCULAR
  Filled 2013-07-25: qty 0.5

## 2013-07-25 MED ORDER — SIMETHICONE 80 MG PO CHEW: 80.0000 mg | CHEWABLE_TABLET | ORAL | Status: DC | PRN

## 2013-07-25 MED ORDER — ZOLPIDEM TARTRATE 5 MG PO TABS: 5.0000 mg | ORAL_TABLET | Freq: Every evening | ORAL | Status: DC | PRN

## 2013-07-25 MED ORDER — ONDANSETRON HCL 4 MG PO TABS: 4.0000 mg | ORAL_TABLET | ORAL | Status: DC | PRN

## 2013-07-25 MED ORDER — WITCH HAZEL-GLYCERIN EX PADS: 1.0000 "application " | MEDICATED_PAD | CUTANEOUS | Status: DC | PRN

## 2013-07-25 MED ORDER — TETANUS-DIPHTH-ACELL PERTUSSIS 5-2.5-18.5 LF-MCG/0.5 IM SUSP: 0.5000 mL | Freq: Once | INTRAMUSCULAR | Status: DC

## 2013-07-25 MED ORDER — DIPHENHYDRAMINE HCL 25 MG PO CAPS: 25.0000 mg | ORAL_CAPSULE | Freq: Four times a day (QID) | ORAL | Status: DC | PRN

## 2013-07-25 MED ORDER — ONDANSETRON HCL 4 MG/2ML IJ SOLN: 4.0000 mg | INTRAMUSCULAR | Status: DC | PRN

## 2013-07-25 MED ORDER — OXYCODONE-ACETAMINOPHEN 5-325 MG PO TABS
1.0000 | ORAL_TABLET | ORAL | Status: DC | PRN
  Administered 2013-07-25 – 2013-07-26 (×3): 1 via ORAL
  Administered 2013-07-26: 2 via ORAL
  Administered 2013-07-26: 1 via ORAL
  Administered 2013-07-27 (×2): 2 via ORAL
  Filled 2013-07-25 (×2): qty 2
  Filled 2013-07-25: qty 1
  Filled 2013-07-25: qty 2
  Filled 2013-07-25 (×3): qty 1

## 2013-07-25 MED ORDER — LANOLIN HYDROUS EX OINT: TOPICAL_OINTMENT | CUTANEOUS | Status: DC | PRN

## 2013-07-25 MED ORDER — PRENATAL MULTIVITAMIN CH
1.0000 | ORAL_TABLET | Freq: Every day | ORAL | Status: DC
  Administered 2013-07-25 – 2013-07-27 (×3): 1 via ORAL
  Filled 2013-07-25 (×3): qty 1

## 2013-07-25 MED ORDER — DIBUCAINE 1 % RE OINT
1.0000 "application " | TOPICAL_OINTMENT | RECTAL | Status: DC | PRN
  Filled 2013-07-25: qty 28

## 2013-07-25 NOTE — Progress Notes (Signed)
Natalie Skinner is a 22 y.o. G2P1001 at [redacted]w[redacted]d  Subjective: Uncomfortable with contractions.  Fentanyl helps.  Vomiting some.  Objective: BP 119/91  Pulse 63  Temp(Src) 98.1 F (36.7 C) (Oral)  Resp 18  Ht 5\' 7"  (1.702 m)  Wt 129.729 kg (286 lb)  BMI 44.78 kg/m2  LMP 11/23/2012   Total I/O In: 121.8 [P.O.:120; I.V.:1.8] Out: -   FHT:  FHR: 1102 to 120s bpm, variability: moderate,  accelerations:  Abscent,  decelerations:  Absent UC:   regular, every 3-4 minutes SVE:   Dilation: 6 Effacement (%): 80 Station: -3 Exam by:: Stinson, DO  Labs: Lab Results  Component Value Date   WBC 13.0* 07/24/2013   HGB 10.5* 07/24/2013   HCT 32.0* 07/24/2013   MCV 82.9 07/24/2013   PLT 353 07/24/2013    Assessment / Plan: Protracted latent phase  Labor: IUPC and FSE placed Fetal Wellbeing:  Category I Pain Control:  Fentanyl I/D:  n/a Anticipated MOD:  NSVD  STINSON, JACOB JEHIEL 07/25/2013, 12:05 AM

## 2013-07-26 ENCOUNTER — Encounter (HOSPITAL_COMMUNITY): Payer: Self-pay | Admitting: *Deleted

## 2013-07-26 NOTE — Progress Notes (Signed)
Clinical Social Work Department BRIEF PSYCHOSOCIAL ASSESSMENT 07/26/2013  Patient:  Natalie Skinner, Natalie Skinner     Account Number:  [id number]     Admit date:  07/24/2013  Clinical Social Worker:  Dennison Bulla  Date/Time:  07/26/2013 02:00 PM  Referred by:  Physician  Date Referred:  07/26/2013 Referred for  Transportation assistance   Other Referral:   Interview type:  Patient Other interview type:    PSYCHOSOCIAL DATA Living Status:  FAMILY Admitted from facility:   Level of care:   Primary support name:  [name] Primary support relationship to patient:  FRIEND Degree of support available:   Strong    CURRENT CONCERNS Current Concerns  Other - See comment   Other Concerns:   Transportation needs    SOCIAL WORK ASSESSMENT / PLAN CSW received referral due to MOB needing assistance at DC. CSW spoke with RN and met with MOB, FOB and baby at bedside. CSW introduced myself and explained role.    MOB reports that she and baby are planning to DC tomorrow. MOB and FOB usually use bus as means of transportation but is worried about taking newborn baby on bus. CSW staffed case with assistant director Timothy Lasso [place]) who is agreeable to provide taxi voucher at DC. CSW informed MOB of this information.    FOB asked about transportation for himself today. CSW explained that a taxi voucher can be used for MOB and baby tomorrow but not just for FOB to return home today. FOB reports understanding and states he will ride the bus or ask a friend for a ride.    CSW will continue to follow to assist with transportation at DC.   Assessment/plan status:  Psychosocial Support/Ongoing Assessment of Needs Other assessment/ plan:   Information/referral to community resources:   Transportation information    PATIENT'S/FAMILY'S RESPONSE TO PLAN OF CARE: MOB alert and oriented. MOB engaged during assessment and thanked CSW for assistance. RN aware of taxi voucher to be given at DC.       Unk Lightning, LCSW (Coverage for Nobie Putnam)

## 2013-07-26 NOTE — Lactation Note (Signed)
This note was copied from the chart of Natalie Skinner. Lactation Consultation Note; Experienced BF mom reports that this baby is nursing well, Reports this baby is nursing much better than her first. Just nursed about 1 hour ago and he is asleep in bassinet. No questions at present. To call prn  Patient Name: Natalie Skinner Date: 07/26/2013 Reason for consult: Follow-up assessment   Maternal Data    Feeding    LATCH Score/Interventions                      Lactation Tools Discussed/Used     Consult Status Consult Status: PRN    Pamelia Hoit 07/26/2013, 10:52 AM

## 2013-07-26 NOTE — Progress Notes (Signed)
Post Partum Day 1 Subjective: no complaints, up ad lib, voiding and tolerating PO.  Having some increased pain on left abdomen  Objective: Blood pressure 112/62, pulse 97, temperature 98.5 F (36.9 C), temperature source Oral, resp. rate 18, height 5\' 7"  (1.702 m), weight 129.729 kg (286 lb), last menstrual period 11/23/2012, SpO2 100.00%, unknown if currently breastfeeding.  Physical Exam:  General: alert, cooperative and no distress Lochia: appropriate Uterine Fundus: firm DVT Evaluation: No evidence of DVT seen on physical exam. Negative Homan's sign. No cords or calf tenderness. No significant calf/ankle edema.   Recent Labs  07/24/13 1630 07/25/13 0545  HGB 10.5* 10.3*  HCT 32.0* 31.4*    Assessment/Plan: Plan for discharge tomorrow, Breastfeeding and Lactation consult   LOS: 2 days   STINSON, JACOB JEHIEL 07/26/2013, 2:46 PM

## 2013-07-26 NOTE — Progress Notes (Signed)
UR completed 

## 2013-07-26 NOTE — Lactation Note (Signed)
This note was copied from the chart of Natalie Skinner. Lactation Consultation Note  Patient Name: Natalie Skinner Date: 07/26/2013  Mother requested to see [facility], concerned that baby isn't satisfied with breastfeeding. Discussed cluster feeding, and enc to undress baby and discussed waking techniques to enc baby to nurse longer at each feeding. Baby LS = 8-9 today, output WNL, and feedings lasting 10-25 mins. Each time.    Maternal Data    Feeding Feeding Type: Breast Fed  LATCH Score/Interventions Latch: Grasps breast easily, tongue down, lips flanged, rhythmical sucking.  Audible Swallowing: Spontaneous and intermittent  Type of Nipple: Everted at rest and after stimulation  Comfort (Breast/Nipple): Soft / non-tender     Hold (Positioning): Assistance needed to correctly position infant at breast and maintain latch.  LATCH Score: 9  Lactation Tools Discussed/Used     Consult Status      Geralynn Ochs 07/26/2013, 9:32 PM

## 2013-07-27 MED ORDER — ACETAMINOPHEN-CODEINE 300-30 MG PO TABS: 1.0000 | ORAL_TABLET | ORAL | Status: DC | PRN

## 2013-07-27 MED ORDER — NORETHINDRONE 0.35 MG PO TABS: 1.0000 | ORAL_TABLET | Freq: Every day | ORAL | Status: DC

## 2013-07-27 MED ORDER — IBUPROFEN 600 MG PO TABS: 600.0000 mg | ORAL_TABLET | Freq: Four times a day (QID) | ORAL | Status: DC

## 2013-07-27 NOTE — Discharge Summary (Signed)
Obstetric Discharge Summary Reason for Admission: onset of labor and rupture of membranes Prenatal Procedures: ultrasound Intrapartum Procedures: spontaneous vaginal delivery Postpartum Procedures: none Complications-Operative and Postpartum: none Hemoglobin  Date Value Range Status  07/25/2013 10.3* 12.0 - 15.0 g/dL Final     HCT  Date Value Range Status  07/25/2013 31.4* 36.0 - 46.0 % Final    Physical Exam:  Filed Vitals:   07/27/13 0535  BP: 112/72  Pulse: 61  Temp: 98.6 F (37 C)  Resp: 18    General: alert, cooperative and appears stated age Lochia: appropriate Uterine Fundus: firm Incision: n/a DVT Evaluation: No evidence of DVT seen on physical exam. Negative Homan's sign.  Discharge Diagnoses: Term Pregnancy-delivered  Discharge Information: Date: 07/27/2013 Activity: pelvic rest Diet: routine Medications: Tylenol #3, Ibuprofen and Micronor Condition: stable Instructions: refer to practice specific booklet Discharge to: home Follow-up Information   Follow up with [facility]- In 6 weeks.   Contact information:   [address]  [address] [place] [address] [phone]     Pt breastfeeding, desires OCPs for family planning.    Newborn Data: Live born female  Birth Weight: 7 lb 11.2 oz (3493 g) APGAR: 8, 9  Home with mother.  [facility] 07/27/2013, 9:05 AM

## 2013-07-31 NOTE — Discharge Summary (Signed)
Attestation of Attending Supervision of Advanced Practitioner (CNM/NP): Evaluation and management procedures were performed by the Advanced Practitioner under my supervision and collaboration. I have reviewed the Advanced Practitioner's note and chart, and I agree with the management and plan.  LEGGETT,KELLY H. 12:48 PM

## 2014-05-29 ENCOUNTER — Encounter (HOSPITAL_COMMUNITY): Payer: Self-pay | Admitting: *Deleted

## 2015-03-30 ENCOUNTER — Emergency Department (HOSPITAL_COMMUNITY)
Admission: EM | Admit: 2015-03-30 | Discharge: 2015-03-30 | Disposition: A | Discharge status: Home / Self Care | Payer: BLUE CROSS/BLUE SHIELD | Attending: Emergency Medicine | Admitting: Emergency Medicine

## 2015-03-30 ENCOUNTER — Encounter (HOSPITAL_COMMUNITY): Payer: Self-pay | Admitting: *Deleted

## 2015-03-30 ENCOUNTER — Emergency Department (HOSPITAL_COMMUNITY): Payer: BLUE CROSS/BLUE SHIELD

## 2015-03-30 DIAGNOSIS — Z720 Tobacco use: Secondary | ICD-10-CM | POA: Diagnosis not present

## 2015-03-30 DIAGNOSIS — J45901 Unspecified asthma with (acute) exacerbation: Secondary | ICD-10-CM | POA: Diagnosis not present

## 2015-03-30 DIAGNOSIS — Z3202 Encounter for pregnancy test, result negative: Secondary | ICD-10-CM | POA: Diagnosis not present

## 2015-03-30 DIAGNOSIS — Z8619 Personal history of other infectious and parasitic diseases: Secondary | ICD-10-CM | POA: Diagnosis not present

## 2015-03-30 DIAGNOSIS — I10 Essential (primary) hypertension: Secondary | ICD-10-CM | POA: Diagnosis not present

## 2015-03-30 DIAGNOSIS — R350 Frequency of micturition: Secondary | ICD-10-CM | POA: Diagnosis not present

## 2015-03-30 DIAGNOSIS — R05 Cough: Secondary | ICD-10-CM | POA: Diagnosis present

## 2015-03-30 DIAGNOSIS — J189 Pneumonia, unspecified organism: Secondary | ICD-10-CM

## 2015-03-30 DIAGNOSIS — J159 Unspecified bacterial pneumonia: Secondary | ICD-10-CM | POA: Diagnosis not present

## 2015-03-30 DIAGNOSIS — Z793 Long term (current) use of hormonal contraceptives: Secondary | ICD-10-CM | POA: Diagnosis not present

## 2015-03-30 LAB — URINALYSIS, ROUTINE W REFLEX MICROSCOPIC
Bilirubin Urine: NEGATIVE
GLUCOSE, UA: NEGATIVE mg/dL
Hgb urine dipstick: NEGATIVE
KETONES UR: NEGATIVE mg/dL
Nitrite: NEGATIVE
Protein, ur: NEGATIVE mg/dL
SPECIFIC GRAVITY, URINE: 1.02 (ref 1.005–1.030)
Urobilinogen, UA: 1 mg/dL (ref 0.0–1.0)
pH: 7 (ref 5.0–8.0)

## 2015-03-30 LAB — URINE MICROSCOPIC-ADD ON

## 2015-03-30 LAB — CBG MONITORING, ED: GLUCOSE-CAPILLARY: 83 mg/dL (ref 65–99)

## 2015-03-30 LAB — PREGNANCY, URINE: PREG TEST UR: NEGATIVE

## 2015-03-30 MED ORDER — IBUPROFEN 800 MG PO TABS
800.0000 mg | ORAL_TABLET | Freq: Once | ORAL | Status: AC
  Administered 2015-03-30: 800 mg via ORAL
  Filled 2015-03-30: qty 1

## 2015-03-30 MED ORDER — NICOTINE 7 MG/24HR TD PT24: 7.0000 mg | MEDICATED_PATCH | Freq: Every day | TRANSDERMAL | Status: DC

## 2015-03-30 MED ORDER — AZITHROMYCIN 250 MG PO TABS: ORAL_TABLET | ORAL | Status: DC

## 2015-03-30 MED ORDER — AZITHROMYCIN 250 MG PO TABS
500.0000 mg | ORAL_TABLET | Freq: Once | ORAL | Status: AC
  Administered 2015-03-30: 500 mg via ORAL
  Filled 2015-03-30: qty 2

## 2015-03-30 MED ORDER — IBUPROFEN 800 MG PO TABS: 800.0000 mg | ORAL_TABLET | Freq: Three times a day (TID) | ORAL | Status: DC | PRN

## 2015-03-30 NOTE — ED Provider Notes (Signed)
CSN: [id number]     Arrival date & time 03/30/15  1220 History   First MD Initiated Contact with Patient 03/30/15 1338     Chief Complaint  Patient presents with  . Cough  . Headache    Ms. Frith is a health 24 year old African American lady presenting with a dry cough, pleuritic chest pain, headaches, myalgias, and increased frequency of urination that started abruptly last night. 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 Her increased urination is new for her; she denies any dysuria or back pain. She says her pleuritic chest pain started after she was coughing last night. She denies any fevers, neck stiffness, changes in vision, photophobia, dyspnea on exertion, leg swelling, leg pain, long car rides, or wheezing.  Patient is a 24 y.o. female presenting with cough and headaches. The history is provided by the patient.  Cough Cough characteristics:  Dry and nocturnal Severity:  Moderate Onset quality:  Sudden Progression:  Worsening Smoker: yes   Relieved by:  Nothing Associated symptoms: headaches, myalgias and shortness of breath   Associated symptoms: no chest pain, no chills, no fever, no rash, no sore throat and no wheezing   Headaches:    Severity:  Moderate   Onset quality:  Sudden   Duration:  1 day   Timing:  Intermittent   Progression:  Unchanged   Chronicity:  New Myalgias:    Location:  Generalized   Severity:  Mild Shortness of breath:    Severity:  Mild   Onset quality:  Sudden   Duration:  1 day   Timing:  Constant Headache Associated symptoms: congestion, cough, myalgias and sinus pressure   Associated symptoms: no fever and no sore throat     Past Medical History  Diagnosis Date  . Chlamydia   . Asthma   . Hypertension   . Normal labor 07/24/2013   Past Surgical History  Procedure Laterality Date  . Hernia repair     History reviewed. No pertinent family history. Social History  Substance Use  Topics  . Smoking status: Current Every Day Smoker  . Smokeless tobacco: Never Used  . Alcohol Use: No   OB History    Gravida Para Term Preterm AB TAB SAB Ectopic Multiple Living   2 2 2  0 0 0 0 0 0 2     Review of Systems  Constitutional: Negative for fever and chills.  HENT: Positive for congestion and sinus pressure. Negative for sore throat.   Respiratory: Positive for cough and shortness of breath. Negative for wheezing.   Cardiovascular: Negative for chest pain.  Genitourinary: Positive for frequency. Negative for dysuria, hematuria, flank pain and menstrual problem.  Musculoskeletal: Positive for myalgias.  Skin: Negative for rash.  Neurological: Positive for headaches. Negative for syncope.    Allergies  Review of patient's allergies indicates no known allergies.  Home Medications   Prior to Admission medications   Medication Sig Start Date End Date Taking? Authorizing Provider  Acetaminophen-Codeine (TYLENOL/CODEINE #3) 300-30 MG per tablet Take 1 tablet by mouth every 4 (four) hours as needed for pain. 07/27/13   Marlis Edelson, CNM  hydrocortisone cream 1 % Apply 1 application topically 4 (four) times daily as needed for itching.    Historical Provider, MD  ibuprofen (ADVIL,MOTRIN) 600 MG tablet Take 1 tablet (600 mg total) by mouth every 6 (six) hours. 07/27/13   Marlis Edelson, CNM  norethindrone (MICRONOR,CAMILA,ERRIN) 0.35 MG tablet Take 1 tablet (0.35 mg total) by mouth daily. 07/27/13   Marlis Edelson, CNM  Prenatal Vit-Fe Fumarate-FA (PRENATAL MULTIVITAMIN) TABS tablet Take 1 tablet by mouth every morning.    Historical Provider, MD   BP 137/100 mmHg  Pulse 82  Temp(Src) 98.8 F (37.1 C) (Oral)  Resp 18  Ht  (1.702 m)  Wt 287 lb (130.182 kg)  BMI 44.94 kg/m2  SpO2 97%   Physical Exam  Constitutional: She appears well-developed and well-nourished.  HENT:  Head: Normocephalic and atraumatic.  Eyes: Conjunctivae and EOM are normal. Pupils are  equal, round, and reactive to light.  Neck: Normal range of motion. Neck supple.  Cardiovascular: Normal rate, regular rhythm and normal heart sounds.   Pulmonary/Chest: Effort normal and breath sounds normal.  Abdominal: Soft. Bowel sounds are normal.  Skin: Skin is warm and dry.    ED Course  Procedures (including critical care time)  Labs Review Labs Reviewed - No data to display  Imaging Review Dg Chest 2 View  03/30/2015   CLINICAL DATA:  Productive cough, greenish yellowish. Vomiting when coughing. Shortness of breath for 2 days. History of smoking, asthma.  EXAM: CHEST  2 VIEW  COMPARISON:  12/07/2003  FINDINGS: Heart size is normal. Mild patchy density is identified within the right middle lobe, consistent with atelectasis or infiltrate. No pleural effusions. No pulmonary edema.  IMPRESSION: Right middle lobe atelectasis or infiltrate.   Electronically Signed   By: Norva Pavlov M.D.   On: 03/30/2015 15:06   I have personally reviewed and evaluated these images and lab results as part of my medical decision-making.   EKG Interpretation None      MDM   Final diagnoses:  CAP (community acquired pneumonia)   24 yo healthy F p/w malaise, myalgias, headache, cough, pleuritic chest pain, chest x-ray notable for right middle lobe infiltrate. I prescribed her a 5 day course azithromycin for CAP. She also complains of urinary frequency but denies dysuria, flank pain, of fever. Urinalysis was negative for UTI and urine pregnancy test was also negative. She was interested in quitting smoking so I prescribed her a  nicotine patch to help her quit.  Selina Cooley, MD 03/30/15 1601  Cathren Laine, MD 03/30/15 1630

## 2015-03-30 NOTE — ED Notes (Signed)
Dr. Steinl at bedside 

## 2015-03-30 NOTE — ED Notes (Signed)
Pt reports having a productive cough and headache since yesterday and no appetite. No acute distress noted at triage.

## 2023-10-04 ENCOUNTER — Encounter (HOSPITAL_COMMUNITY): Payer: Self-pay

## 2023-10-04 ENCOUNTER — Ambulatory Visit (HOSPITAL_COMMUNITY)
Admission: EM | Admit: 2023-10-04 | Discharge: 2023-10-04 | Disposition: A | Discharge status: Home / Self Care | Attending: Family Medicine | Admitting: Family Medicine

## 2023-10-04 ENCOUNTER — Ambulatory Visit (INDEPENDENT_AMBULATORY_CARE_PROVIDER_SITE_OTHER)

## 2023-10-04 DIAGNOSIS — R0789 Other chest pain: Secondary | ICD-10-CM

## 2023-10-04 MED ORDER — KETOROLAC TROMETHAMINE 10 MG PO TABS: 10.0000 mg | ORAL_TABLET | Freq: Four times a day (QID) | ORAL | 0 refills | Status: DC | PRN

## 2023-10-04 MED ORDER — KETOROLAC TROMETHAMINE 30 MG/ML IJ SOLN
INTRAMUSCULAR | Status: AC
  Filled 2023-10-04: qty 1

## 2023-10-04 MED ORDER — KETOROLAC TROMETHAMINE 30 MG/ML IJ SOLN
30.0000 mg | Freq: Once | INTRAMUSCULAR | Status: AC
  Administered 2023-10-04: 30 mg via INTRAMUSCULAR
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
## 2023-10-04 NOTE — Discharge Instructions (Signed)
 Your chest x-ray was read as normal.  You have been given a shot of Toradol 30 mg today.  Ketorolac 10 mg tablets--take 1 tablet every 6 hours as needed for pain.  This is the same medicine that is in the shot we just gave you  Ice the sore area and rest.
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
## 2023-12-28 ENCOUNTER — Other Ambulatory Visit: Payer: Self-pay

## 2023-12-28 ENCOUNTER — Ambulatory Visit (HOSPITAL_COMMUNITY)
Admission: EM | Admit: 2023-12-28 | Discharge: 2023-12-28 | Disposition: A | Discharge status: Home / Self Care | Attending: Family Medicine | Admitting: Family Medicine

## 2023-12-28 ENCOUNTER — Encounter (HOSPITAL_COMMUNITY): Payer: Self-pay | Admitting: *Deleted

## 2023-12-28 ENCOUNTER — Ambulatory Visit (INDEPENDENT_AMBULATORY_CARE_PROVIDER_SITE_OTHER)

## 2023-12-28 DIAGNOSIS — R0602 Shortness of breath: Secondary | ICD-10-CM

## 2023-12-28 DIAGNOSIS — J069 Acute upper respiratory infection, unspecified: Secondary | ICD-10-CM | POA: Diagnosis not present

## 2023-12-28 MED ORDER — PROMETHAZINE-DM 6.25-15 MG/5ML PO SYRP: 5.0000 mL | ORAL_SOLUTION | Freq: Four times a day (QID) | ORAL | 0 refills | Status: AC | PRN

## 2023-12-28 NOTE — ED Provider Notes (Signed)
 [facility]   [id number] 12/28/23 Arrival Time: 6578  ASSESSMENT & PLAN:  1. SOB (shortness of breath)   2. Viral URI with cough    I have personally viewed and independently interpreted the imaging studies ordered this visit. CXR: no acute changes/infiltrates.  Discussed typical duration of likely viral illness. OTC symptom care as needed.  Meds ordered this encounter  Medications   promethazine-dextromethorphan (PROMETHAZINE-DM) 6.25-15 MG/5ML syrup    Sig: Take 5 mLs by mouth 4 (four) times daily as needed for cough.    Dispense:  118 mL    Refill:  0   Work note provided.  Follow-up Information     [facility] at [facility].   Specialty: Urgent Care Why: If worsening or failing to improve as anticipated. Contact information: [address] [place]  [address] [phone]                Reviewed expectations re: course of current medical issues. Questions answered. Outlined signs and symptoms indicating need for more acute intervention. Understanding verbalized. After Visit Summary given.   SUBJECTIVE: History from: Patient. Natalie Skinner is a 33 y.o. female. PT reports cough and SOB; abrupt onset; x 48 hours. Fatigued. Has missed work. Denies: fever/CP. Normal PO intake without n/v/d.  OBJECTIVE:  Vitals:   12/28/23 0902  BP: (!) 153/106  Pulse: 76  Resp: 20  Temp: 98.4 F (36.9 C)  SpO2: 92%    General appearance: alert; no distress; appears fatigued Eyes: PERRLA; EOMI; conjunctiva normal HENT: [place]; AT; with nasal congestion Neck: supple  Lungs: speaks full sentences without difficulty; unlabored; CTAB; active coughing Extremities: no edema Skin: warm and dry Neurologic: normal gait Psychological: alert and cooperative; normal mood and affect  Labs:  Labs Reviewed - No data to display  Imaging: DG Chest 2 View Result Date: 12/28/2023 CLINICAL DATA:  Shortness of breath with cough and  wheezing for 2 days. EXAM: CHEST - 2 VIEW COMPARISON:  Radiographs 10/04/2023 and 03/30/2015. FINDINGS: The heart size and mediastinal contours are normal. The lungs are clear. There is no pleural effusion or pneumothorax. No acute osseous findings are identified. IMPRESSION: No evidence of acute cardiopulmonary process. Electronically Signed   By: Elmon Hagedorn M.D.   On: 12/28/2023 10:08    No Known Allergies  Past Medical History:  Diagnosis Date   Asthma    Chlamydia    Hypertension    Normal labor 07/24/2013   Social History   Socioeconomic History   Marital status: Single    Spouse name: Not on file   Number of children: Not on file   Years of education: Not on file   Highest education level: Not on file  Occupational History   Not on file  Tobacco Use   Smoking status: Every Day    Current packs/day: 0.25    Types: Cigarettes   Smokeless tobacco: Never  Vaping Use   Vaping status: Never Used  Substance and Sexual Activity   Alcohol use: No   Drug use: No   Sexual activity: Yes    Birth control/protection: Implant  Other Topics Concern   Not on file  Social History Narrative   Not on file   Social Drivers of Health   Financial Resource Strain: Not on file  Food Insecurity: Low Risk  (09/14/2023)   Received from [facility]   Hunger Vital Sign    Worried About Running Out of Food in the Last Year: Never  true    Ran Out of Food in the Last Year: Never true  Transportation Needs: No Transportation Needs (09/14/2023)   Received from [facility]    In the past 12 months, has lack of reliable transportation kept you from medical appointments, meetings, work or from getting things needed for daily living? : No  Physical Activity: Not on file  Stress: Not on file  Social Connections: Unknown (12/09/2021)   Received from [facility]   Social Network    Social Network: Not on file  Intimate Partner Violence: Not At Risk (12/24/2022)    Received from [facility]   HITS    Over the last 12 months how often did your partner physically hurt you?: Never    Over the last 12 months how often did your partner insult you or talk down to you?: Never    Over the last 12 months how often did your partner threaten you with physical harm?: Never    Over the last 12 months how often did your partner scream or curse at you?: Never   History reviewed. No pertinent family history. Past Surgical History:  Procedure Laterality Date   HERNIA REPAIR       Afton Albright, MD 12/28/23 [phone]
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
